# Patient Record
Sex: Male | Born: 1990 | Race: White | Hispanic: No | Marital: Married | Smoking: Current every day smoker
Health system: Southern US, Community
[De-identification: ages and names within clinical notes are randomized; demographics above are authoritative.]

## PROBLEM LIST (undated history)

## (undated) DIAGNOSIS — Z9889 Other specified postprocedural states: Secondary | ICD-10-CM

## (undated) DIAGNOSIS — F909 Attention-deficit hyperactivity disorder, unspecified type: Secondary | ICD-10-CM

## (undated) DIAGNOSIS — K649 Unspecified hemorrhoids: Secondary | ICD-10-CM

## (undated) HISTORY — DX: Attention-deficit hyperactivity disorder, unspecified type: F90.9

## (undated) HISTORY — PX: SHOULDER SURGERY: SHX246

---

## 2012-08-12 ENCOUNTER — Encounter (HOSPITAL_BASED_OUTPATIENT_CLINIC_OR_DEPARTMENT_OTHER): Payer: Self-pay | Admitting: *Deleted

## 2012-08-12 ENCOUNTER — Emergency Department (HOSPITAL_BASED_OUTPATIENT_CLINIC_OR_DEPARTMENT_OTHER)
Admission: EM | Admit: 2012-08-12 | Discharge: 2012-08-12 | Disposition: A | Discharge status: Home / Self Care | Payer: BC Managed Care – PPO | Attending: Emergency Medicine | Admitting: Emergency Medicine

## 2012-08-12 DIAGNOSIS — K645 Perianal venous thrombosis: Secondary | ICD-10-CM

## 2012-08-12 DIAGNOSIS — F17200 Nicotine dependence, unspecified, uncomplicated: Secondary | ICD-10-CM | POA: Insufficient documentation

## 2012-08-12 DIAGNOSIS — Z9889 Other specified postprocedural states: Secondary | ICD-10-CM | POA: Insufficient documentation

## 2012-08-12 DIAGNOSIS — K921 Melena: Secondary | ICD-10-CM | POA: Insufficient documentation

## 2012-08-12 DIAGNOSIS — Z79899 Other long term (current) drug therapy: Secondary | ICD-10-CM | POA: Insufficient documentation

## 2012-08-12 HISTORY — DX: Other specified postprocedural states: Z98.890

## 2012-08-12 HISTORY — DX: Unspecified hemorrhoids: K64.9

## 2012-08-12 MED ORDER — HYDROCORTISONE 2.5 % RE CREA: TOPICAL_CREAM | Freq: Two times a day (BID) | RECTAL | Status: DC

## 2012-08-12 MED ORDER — POLYETHYLENE GLYCOL 3350 17 GM/SCOOP PO POWD: 17.0000 g | Freq: Two times a day (BID) | ORAL | Status: AC

## 2012-08-12 MED ORDER — HYDROCODONE-ACETAMINOPHEN 5-325 MG PO TABS
1.0000 | ORAL_TABLET | Freq: Once | ORAL | Status: AC
  Administered 2012-08-12: 1 via ORAL
  Filled 2012-08-12: qty 1

## 2012-08-12 MED ORDER — HYDROCODONE-ACETAMINOPHEN 5-325 MG PO TABS: 1.0000 | ORAL_TABLET | ORAL | Status: DC | PRN

## 2012-08-12 NOTE — ED Provider Notes (Signed)
History  This chart was scribed for Jordan Human, MD by Greggory Stallion, ED Scribe. This patient was seen in room MH03/MH03 and the patient's care was started at 6:10 PM.   CSN: [id number]  Arrival date & time 08/12/12  1517   First MD Initiated Contact with Patient 08/12/12 1810      Chief Complaint  Patient presents with  . Rectal Pain     Patient is a 22 y.o. male presenting with hematochezia. The history is provided by the patient. No language interpreter was used.  Rectal Bleeding  The current episode started more than 2 weeks ago. The onset was gradual. The problem occurs frequently. The problem has been unchanged. The pain is moderate. Ineffective treatments: Preparation H. Associated symptoms include hemorrhoids and rectal pain. Pertinent negatives include no fever, no abdominal pain, no diarrhea, no vomiting, no chest pain, no headaches, no coughing, no difficulty breathing and no rash.    HPI Comments: Jordan Crawford is a 22 y.o. male with h/o hemorrhoids who presents to the Emergency Department complaining of intermittent, moderate rectal pain with associated rectal bleeding for the past 2 weeks. Patient reports a history of hemorrhoids since high school. He says that he has occasional flare ups, but for the past year they have been more frequent. Pt states he has taken Preparation H at home without any relief. He has not tried any other therapies. Pt denies fever, sore throat, chest pain, abdominal pain, cough, congestion, and rash as associated symptoms. Pt states he smokes half a pack of cigarettes a day. Pt denies any asthma, diabetes, and HTN. Pt states he has had shoulder surgery due to dislocation. Pt states he is currently on Adderall 30 mg.    Past Medical History  Diagnosis Date  . Hemorrhoid   . H/O shoulder surgery     History reviewed. No pertinent past surgical history.  History reviewed. No pertinent family history.  History  Substance Use Topics  .  Smoking status: Current Every Day Smoker  . Smokeless tobacco: Not on file  . Alcohol Use: No      Review of Systems  Constitutional: Negative for fever.  HENT: Negative for congestion, sore throat and rhinorrhea.   Eyes: Negative for visual disturbance.  Respiratory: Negative for cough.   Cardiovascular: Negative for chest pain.  Gastrointestinal: Positive for hematochezia, rectal pain and hemorrhoids. Negative for vomiting, abdominal pain and diarrhea.  Genitourinary: Negative for difficulty urinating.  Skin: Negative for rash.  Neurological: Negative for headaches.  All other systems reviewed and are negative.    Allergies  Review of patient's allergies indicates no known allergies.  Home Medications   Current Outpatient Rx  Name  Route  Sig  Dispense  Refill  . amphetamine-dextroamphetamine (ADDERALL) 30 MG tablet   Oral   Take 30 mg by mouth daily.           Triage Vitals: BP 108/76  Pulse 86  Temp(Src) 98.1 F (36.7 C) (Oral)  Resp 15  Ht 5\' 10"  (1.778 m)  Wt 165 lb (74.844 kg)  BMI 23.68 kg/m2  SpO2 100%  Physical Exam  Constitutional: He is oriented to person, place, and time. He appears well-developed and well-nourished.  HENT:  Head: Normocephalic and atraumatic.  Eyes: Conjunctivae and EOM are normal. Pupils are equal, round, and reactive to light.  Neck: Normal range of motion. Neck supple.  Cardiovascular: Normal rate, regular rhythm and normal heart sounds.   Pulmonary/Chest: Effort normal and breath sounds  normal.  Abdominal: Soft. There is no tenderness.  Genitourinary:  Thrombosed hemorrhoid that has bled on the right lateral wall of rectum. Tender to palpation.  Musculoskeletal: Normal range of motion.  Neurological: He is alert and oriented to person, place, and time.  Skin: Skin is warm and dry.    ED Course  Procedures (including critical care time)  DIAGNOSTIC STUDIES: Oxygen Saturation is 100% on RA, normal by my interpretation.     COORDINATION OF CARE: 6:48 PM-Discussed treatment plan which includes Anusol-HC cream, miralax and hydrocodone-acetaminophen with pt at bedside and pt agreed to plan.         1. Thrombosed external hemorrhoid     I personally performed the services described in this documentation, which was scribed in my presence. The recorded information has been reviewed and is accurate.  Jordan Crawford, M.D.      Carleene Cooper III, MD 08/13/12 [phone]

## 2012-08-12 NOTE — ED Notes (Signed)
Pt states he has prob with hem off and on for about a year. This episode x 2 weeks. Some bleeding noted.

## 2013-11-07 ENCOUNTER — Emergency Department (HOSPITAL_BASED_OUTPATIENT_CLINIC_OR_DEPARTMENT_OTHER): Payer: BC Managed Care – PPO

## 2013-11-07 ENCOUNTER — Encounter (HOSPITAL_BASED_OUTPATIENT_CLINIC_OR_DEPARTMENT_OTHER): Payer: Self-pay | Admitting: Emergency Medicine

## 2013-11-07 ENCOUNTER — Emergency Department (HOSPITAL_BASED_OUTPATIENT_CLINIC_OR_DEPARTMENT_OTHER)
Admission: EM | Admit: 2013-11-07 | Discharge: 2013-11-07 | Disposition: A | Discharge status: Home / Self Care | Payer: BC Managed Care – PPO | Attending: Emergency Medicine | Admitting: Emergency Medicine

## 2013-11-07 DIAGNOSIS — Z9889 Other specified postprocedural states: Secondary | ICD-10-CM | POA: Insufficient documentation

## 2013-11-07 DIAGNOSIS — Z8679 Personal history of other diseases of the circulatory system: Secondary | ICD-10-CM | POA: Insufficient documentation

## 2013-11-07 DIAGNOSIS — S46909A Unspecified injury of unspecified muscle, fascia and tendon at shoulder and upper arm level, unspecified arm, initial encounter: Secondary | ICD-10-CM | POA: Insufficient documentation

## 2013-11-07 DIAGNOSIS — Z79899 Other long term (current) drug therapy: Secondary | ICD-10-CM | POA: Insufficient documentation

## 2013-11-07 DIAGNOSIS — S4980XA Other specified injuries of shoulder and upper arm, unspecified arm, initial encounter: Secondary | ICD-10-CM | POA: Insufficient documentation

## 2013-11-07 DIAGNOSIS — S42101A Fracture of unspecified part of scapula, right shoulder, initial encounter for closed fracture: Secondary | ICD-10-CM

## 2013-11-07 DIAGNOSIS — F17200 Nicotine dependence, unspecified, uncomplicated: Secondary | ICD-10-CM | POA: Insufficient documentation

## 2013-11-07 DIAGNOSIS — S42109A Fracture of unspecified part of scapula, unspecified shoulder, initial encounter for closed fracture: Secondary | ICD-10-CM | POA: Insufficient documentation

## 2013-11-07 DIAGNOSIS — Y92410 Unspecified street and highway as the place of occurrence of the external cause: Secondary | ICD-10-CM | POA: Insufficient documentation

## 2013-11-07 DIAGNOSIS — Y9389 Activity, other specified: Secondary | ICD-10-CM | POA: Insufficient documentation

## 2013-11-07 MED ORDER — HYDROMORPHONE HCL PF 1 MG/ML IJ SOLN
1.0000 mg | Freq: Once | INTRAMUSCULAR | Status: AC
  Administered 2013-11-07: 1 mg via INTRAVENOUS
  Filled 2013-11-07: qty 1

## 2013-11-07 MED ORDER — OXYCODONE-ACETAMINOPHEN 5-325 MG PO TABS: 1.0000 | ORAL_TABLET | ORAL | Status: DC | PRN

## 2013-11-07 MED ORDER — ONDANSETRON HCL 4 MG/2ML IJ SOLN
4.0000 mg | Freq: Once | INTRAMUSCULAR | Status: AC
  Administered 2013-11-07: 4 mg via INTRAVENOUS
  Filled 2013-11-07: qty 2

## 2013-11-07 NOTE — Discharge Instructions (Signed)
Scapular Fracture You have a fracture (break in bone) of your scapula. This is your shoulder blade. It is the large flat bone behind your shoulder. This is also the bone that makes up the ball and socket joint of your shoulder. Most of the time surgery is not required for injuries to this bone unless the socket of the shoulder joint is involved. DIAGNOSIS  Because of the severity of force usually required to break this bone, x-rays are often taken of other bones likely to be injured at the same time. X-rays of the hip, knee, and pelvis may be taken. Specialized x-rays (arteriograms) may be needed if there are injuries to large blood vessels associated with this injury. HOME CARE INSTRUCTIONS   Simple fractures of the scapula can be treated with a sling and swathe type of immobilization. This means the involved area is held in place by putting the arm in a sling. A wrap is made around the upper arm with the sling holding the arm next to the chest. This may be removed for bathing as instructed by your caregiver.  Apply ice to the injury for 15-20 minutes 03-04 times per day. Put the ice in a plastic bag. Place a towel between the bag of ice and your skin, splint, or immobilization device.  Do not resume use until instructed by your caregiver. Usually full rehabilitation (exercises to improve the injury site) will begin sometime after the sling and swathe are removed. Then begin use gradually as directed. Do not increase use to the point of pain. If pain develops, decrease use and continue the above measures. Slowly increase activities that do not cause discomfort until you gradually achieve normal use without pain.  Only take over-the-counter or prescription medicines for pain, discomfort, or fever as directed by your caregiver. SEEK IMMEDIATE MEDICAL CARE IF:   Your pain and swelling increase and is uncontrolled with medications.  You develop new, unexplained symptoms or an increase of the symptoms  which brought you to your caregiver.  You develop shortness or breath or cough up blood.  You are unable to move your arm or fingers. You develop warmth and swelling in your affected arm.  You develop an unexplained temperature. Document Released: 04/11/2005 Document Revised: 07/04/2011 Document Reviewed: 03/03/2006 [facility] Patient Information 2015 [place], [place]. This information is not intended to replace advice given to you by your health care provider. Make sure you discuss any questions you have with your health care provider.   Sling Use After Injury or Surgery You have been put in a sling today because of an injury or following surgery. If you have a tendon or bone injury it may take up to 6 weeks to heal. Use the sling as directed until your caregiver says it is no longer needed. The sling protects and keeps you from using the injured part. Hanging your arm in a sling will give rest and support to the injured part. This also helps with comfort and healing. Slings are used for injuries made worse or more painful by movement. Examples include: Broken arms. Broken collarbones. Shoulder injuries. Following surgery. The sling should fit comfortably, with your elbow at one end of the sling and your hand at the other end. Your elbow is bent 90 degrees lying across your waist and rests in the sling with your thumb pointing up. Make sure that the hand of the injured arm does not droop down. That could stretch some nerves in the wrist. Your hand should be slightly  higher than your elbow. You may also pad the sling behind your neck with some cloth or foam rubber.  A swathe may also be used if it is necessary to keep you from lifting your injured arm. A swathe is a wrap or ace bandage that goes around your chest over your injured arm.  To take the weight off your neck, some slings have a strap that goes around your neck and down your back. One strap is connected to the closed elbow side of the sling  with the other end of the strap attached to the wrist side. With a sling like this, your injured shoulder, arm, wrist, or hand is in the sling, the weight is more on your shoulder and back. This is different from the illustration where the sling is supported only by the neck.  In an emergency, a sling can be as simple as a belt or towel tied around your neck to hold your forearm.  HOME CARE INSTRUCTIONS  Do not use your shoulder until instructed to by your caregiver. If you have been prescribed physical therapy, keep appointments as directed. For the first couple days following your injury and during times when you are sore, you may use ice on the injured area for 15-20 minutes 03-04 times per day while awake. Put the ice in a plastic bag and place a towel between the bag of ice and your skin. This will help keep the swelling down. If there is numbness in the fifth finger and ring fingers you may need to pad the elbow to relieve pressure on the ulnar nerve (the crazy bone). Keep your arm on your chest when lying down. If a plaster splint was applied, wear the splint until you are seen for a follow-up examination. Rest it on nothing harder than a pillow the first 24 hours. Do not get it wet. You may take it off to take a shower or bath unless instructed otherwise by your caregiver. You may have been given an elastic bandage to use with the plaster splint or alone. The splint is too tight if you have numbness, tingling, or if your hand becomes cold and blue. Adjust or reapply the bandage to make it comfortable. Only take over-the-counter or prescription medicines for pain, discomfort, or fever as directed by your caregiver. If range of motion exercises are permitted by your caregiver, do not go over the limits suggested. If you have increased pain from doing gentle exercises, stop the exercises until you see your caregiver again. The length of time needed for healing depends on what your injury or surgery  was. SEEK IMMEDIATE MEDICAL CARE IF:  You have an increase in bruising, swelling or pain in the area of your injury or surgery. You notice a blue color of or coldness in your fingers. Pain relief is not obtained with medications or any of your problems are getting worse. Document Released: 11/24/2003 Document Revised: 03/28/2012 Document Reviewed: 02/24/2007 [facility] Patient Information 2015 [place], [place]. This information is not intended to replace advice given to you by your health care provider. Make sure you discuss any questions you have with your health care provider.
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
## 2013-11-15 ENCOUNTER — Encounter: Payer: Self-pay | Admitting: Family Medicine

## 2013-11-15 ENCOUNTER — Ambulatory Visit (INDEPENDENT_AMBULATORY_CARE_PROVIDER_SITE_OTHER): Payer: BC Managed Care – PPO | Admitting: Family Medicine

## 2013-11-15 VITALS — BP 107/68 | HR 81 | Ht 70.0 in | Wt 175.0 lb

## 2013-11-15 DIAGNOSIS — S42101A Fracture of unspecified part of scapula, right shoulder, initial encounter for closed fracture: Secondary | ICD-10-CM

## 2013-11-15 DIAGNOSIS — S42109A Fracture of unspecified part of scapula, unspecified shoulder, initial encounter for closed fracture: Secondary | ICD-10-CM

## 2013-11-15 MED ORDER — OXYCODONE-ACETAMINOPHEN 7.5-325 MG PO TABS: 1.0000 | ORAL_TABLET | Freq: Four times a day (QID) | ORAL | Status: DC | PRN

## 2013-11-15 NOTE — Patient Instructions (Signed)
You have a right scapular fracture. These typically heal extremely well after 6 weeks. Follow up with me on or close to 8/27 - out of work until that time. Percocet as needed for severe pain. Ok to take aleve or ibuprofen as needed in addition to this. Call me in 2 weeks for a refill on the pain medicine. Icing as needed 15 minutes at a time. Sling for comfort.

## 2013-11-18 ENCOUNTER — Encounter: Payer: Self-pay | Admitting: Family Medicine

## 2013-11-18 DIAGNOSIS — S42101A Fracture of unspecified part of scapula, right shoulder, initial encounter for closed fracture: Secondary | ICD-10-CM | POA: Insufficient documentation

## 2013-11-18 NOTE — Assessment & Plan Note (Signed)
Right scapular body fracture - minimally displaced.  Expect 6 total weeks to heal.  Sling for comfort as needed.  Icing, percocet for pain.  NSAIDs as well.  F/u on 8/27.  Out of work in the meantime.
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
## 2013-12-02 ENCOUNTER — Telehealth: Payer: Self-pay | Admitting: Family Medicine

## 2013-12-02 DIAGNOSIS — S42101A Fracture of unspecified part of scapula, right shoulder, initial encounter for closed fracture: Secondary | ICD-10-CM

## 2013-12-02 MED ORDER — OXYCODONE-ACETAMINOPHEN 7.5-325 MG PO TABS: 1.0000 | ORAL_TABLET | Freq: Four times a day (QID) | ORAL | Status: DC | PRN

## 2013-12-02 NOTE — Telephone Encounter (Signed)
Yes - he has a scapular body fracture  Will print out and place up front for him to pick up.  Thanks!

## 2013-12-18 ENCOUNTER — Encounter: Payer: Self-pay | Admitting: Family Medicine

## 2013-12-18 ENCOUNTER — Ambulatory Visit (INDEPENDENT_AMBULATORY_CARE_PROVIDER_SITE_OTHER): Payer: BC Managed Care – PPO | Admitting: Family Medicine

## 2013-12-18 VITALS — BP 127/80 | HR 62 | Ht 70.0 in | Wt 190.0 lb

## 2013-12-18 DIAGNOSIS — S42101A Fracture of unspecified part of scapula, right shoulder, initial encounter for closed fracture: Secondary | ICD-10-CM

## 2013-12-18 DIAGNOSIS — S42109A Fracture of unspecified part of scapula, unspecified shoulder, initial encounter for closed fracture: Secondary | ICD-10-CM

## 2013-12-18 MED ORDER — TRAMADOL HCL 50 MG PO TABS: 50.0000 mg | ORAL_TABLET | Freq: Four times a day (QID) | ORAL | Status: DC | PRN

## 2013-12-19 ENCOUNTER — Encounter: Payer: Self-pay | Admitting: Family Medicine
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
## 2013-12-23 ENCOUNTER — Ambulatory Visit: Payer: BC Managed Care – PPO | Admitting: Family Medicine

## 2016-10-11 ENCOUNTER — Emergency Department (HOSPITAL_BASED_OUTPATIENT_CLINIC_OR_DEPARTMENT_OTHER)
Admission: EM | Admit: 2016-10-11 | Discharge: 2016-10-12 | Disposition: A | Discharge status: Home / Self Care | Payer: Managed Care, Other (non HMO) | Attending: Physician Assistant | Admitting: Physician Assistant

## 2016-10-11 ENCOUNTER — Emergency Department (HOSPITAL_BASED_OUTPATIENT_CLINIC_OR_DEPARTMENT_OTHER): Payer: Managed Care, Other (non HMO)

## 2016-10-11 ENCOUNTER — Encounter (HOSPITAL_BASED_OUTPATIENT_CLINIC_OR_DEPARTMENT_OTHER): Payer: Self-pay | Admitting: Emergency Medicine

## 2016-10-11 DIAGNOSIS — F17210 Nicotine dependence, cigarettes, uncomplicated: Secondary | ICD-10-CM | POA: Insufficient documentation

## 2016-10-11 DIAGNOSIS — Z79899 Other long term (current) drug therapy: Secondary | ICD-10-CM | POA: Insufficient documentation

## 2016-10-11 DIAGNOSIS — N200 Calculus of kidney: Secondary | ICD-10-CM | POA: Diagnosis not present

## 2016-10-11 DIAGNOSIS — R1084 Generalized abdominal pain: Secondary | ICD-10-CM | POA: Diagnosis present

## 2016-10-11 LAB — CBC WITH DIFFERENTIAL/PLATELET
BASOS ABS: 0 10*3/uL (ref 0.0–0.1)
BASOS PCT: 0 %
EOS ABS: 0.1 10*3/uL (ref 0.0–0.7)
EOS PCT: 1 %
HCT: 40.4 % (ref 39.0–52.0)
HEMOGLOBIN: 14.7 g/dL (ref 13.0–17.0)
Lymphocytes Relative: 19 %
Lymphs Abs: 1.8 10*3/uL (ref 0.7–4.0)
MCH: 30.9 pg (ref 26.0–34.0)
MCHC: 36.4 g/dL — ABNORMAL HIGH (ref 30.0–36.0)
MCV: 85.1 fL (ref 78.0–100.0)
Monocytes Absolute: 0.6 10*3/uL (ref 0.1–1.0)
Monocytes Relative: 6 %
NEUTROS PCT: 74 %
Neutro Abs: 7 10*3/uL (ref 1.7–7.7)
PLATELETS: 269 10*3/uL (ref 150–400)
RBC: 4.75 MIL/uL (ref 4.22–5.81)
RDW: 12.8 % (ref 11.5–15.5)
WBC: 9.5 10*3/uL (ref 4.0–10.5)

## 2016-10-11 LAB — URINALYSIS, ROUTINE W REFLEX MICROSCOPIC
GLUCOSE, UA: 100 mg/dL — AB
Ketones, ur: 40 mg/dL — AB
Nitrite: NEGATIVE
PROTEIN: 100 mg/dL — AB
Specific Gravity, Urine: 1.027 (ref 1.005–1.030)
pH: 8.5 — ABNORMAL HIGH (ref 5.0–8.0)

## 2016-10-11 LAB — URINALYSIS, MICROSCOPIC (REFLEX)

## 2016-10-11 LAB — COMPREHENSIVE METABOLIC PANEL
ALT: 22 U/L (ref 17–63)
AST: 27 U/L (ref 15–41)
Albumin: 4.9 g/dL (ref 3.5–5.0)
Alkaline Phosphatase: 67 U/L (ref 38–126)
Anion gap: 12 (ref 5–15)
BILIRUBIN TOTAL: 1 mg/dL (ref 0.3–1.2)
BUN: 14 mg/dL (ref 6–20)
CALCIUM: 10 mg/dL (ref 8.9–10.3)
CHLORIDE: 103 mmol/L (ref 101–111)
CO2: 23 mmol/L (ref 22–32)
CREATININE: 1.2 mg/dL (ref 0.61–1.24)
GFR calc Af Amer: >60 mL/min (ref >60)
Glucose, Bld: 116 mg/dL — ABNORMAL HIGH (ref 65–99)
Potassium: 3.3 mmol/L — ABNORMAL LOW (ref 3.5–5.1)
Sodium: 138 mmol/L (ref 135–145)
TOTAL PROTEIN: 8 g/dL (ref 6.5–8.1)

## 2016-10-11 MED ORDER — MORPHINE SULFATE (PF) 4 MG/ML IV SOLN
4.0000 mg | Freq: Once | INTRAVENOUS | Status: AC
  Administered 2016-10-11: 4 mg via INTRAVENOUS
  Filled 2016-10-11: qty 1

## 2016-10-11 MED ORDER — ONDANSETRON HCL 4 MG/2ML IJ SOLN
4.0000 mg | Freq: Once | INTRAMUSCULAR | Status: AC
  Administered 2016-10-11: 4 mg via INTRAVENOUS
  Filled 2016-10-11: qty 2

## 2016-10-11 MED ORDER — OXYCODONE-ACETAMINOPHEN 5-325 MG PO TABS
1.0000 | ORAL_TABLET | Freq: Once | ORAL | Status: AC
  Administered 2016-10-11: 1 via ORAL
  Filled 2016-10-11: qty 1

## 2016-10-11 MED ORDER — ONDANSETRON 4 MG PO TBDP
4.0000 mg | ORAL_TABLET | Freq: Once | ORAL | Status: AC
  Administered 2016-10-11: 4 mg via ORAL
  Filled 2016-10-11: qty 1

## 2016-10-11 MED ORDER — OXYCODONE-ACETAMINOPHEN 5-325 MG PO TABS: 1.0000 | ORAL_TABLET | Freq: Four times a day (QID) | ORAL | 0 refills | Status: DC | PRN

## 2016-10-11 MED ORDER — TAMSULOSIN HCL 0.4 MG PO CAPS: 0.4000 mg | ORAL_CAPSULE | Freq: Every day | ORAL | 0 refills | Status: AC

## 2016-10-11 MED ORDER — SODIUM CHLORIDE 0.9 % IV BOLUS (SEPSIS)
1000.0000 mL | Freq: Once | INTRAVENOUS | Status: AC
  Administered 2016-10-11: 1000 mL via INTRAVENOUS

## 2016-10-11 NOTE — ED Triage Notes (Signed)
Patient states that he started to have pain to his left lower back. Patient was given 60 of IM tordal at an urgent care and sent her for further evaluation. Patient also reports that he has had N/V.

## 2016-10-11 NOTE — ED Provider Notes (Signed)
[facility] Provider Note   CSN: [id number] Arrival date & time: 10/11/16  2016   By signing my name below, I, Clarisse Gouge, attest that this documentation has been prepared under the direction and in the presence of Christmas Faraci, Cindee Salt, MD. Electronically signed, Clarisse Gouge, ED Scribe. 10/11/16. 9:17 PM.   History   Chief Complaint Chief Complaint  Patient presents with  . Flank Pain   The history is provided by the patient and medical records. No language interpreter was used.    Jordan Crawford is a 26 y.o. male presnting to the Emergency Department concerning gradually worsening L flank pain since 4 PM today. Pt alleges his pain began right after a BM. He states his pain became severe ~6 PM and he describes constant sharp, throbbing pain worse with certain movements and contact. Some L leg numbness, N/V and dark urine noted. Pt at baseline until onset of presenting symptoms. Pt sent to [facility] ED from UC to R/O kidney stone. Pt given toradol at UC with no relief. No h/o kidney stones. No recent injury/ trauma. No dysuria, hematuria, fever. No other complaints at this time.   Past Medical History:  Diagnosis Date  . ADHD (attention deficit hyperactivity disorder)   . H/O shoulder surgery   . Hemorrhoid     Patient Active Problem List   Diagnosis Date Noted  . Right scapula fracture 11/18/2013    Past Surgical History:  Procedure Laterality Date  . SHOULDER SURGERY Left    rotator cuff       Home Medications    Prior to Admission medications   Medication Sig Start Date End Date Taking? Authorizing Provider  amphetamine-dextroamphetamine (ADDERALL) 30 MG tablet Take 30 mg by mouth daily.    [provider]  oxyCODONE-acetaminophen (PERCOCET) 7.5-325 MG per tablet Take 1 tablet by mouth. 12/02/13   [provider]  polyethylene glycol powder (GLYCOLAX/MIRALAX) powder Take 17 g by mouth 2 (two) times daily. 08/12/12   Carleene Cooper, MD    traMADol (ULTRAM) 50 MG tablet Take 1 tablet (50 mg total) by mouth every 6 (six) hours as needed. 12/18/13   Lenda Kelp, MD    Family History History reviewed. No pertinent family history.  Social History Social History  Substance Use Topics  . Smoking status: Current Every Day Smoker    Packs/day: 0.50    Types: Cigarettes  . Smokeless tobacco: Never Used  . Alcohol use No     Allergies   Patient has no known allergies.   Review of Systems Review of Systems  Constitutional: Negative for fever.  Gastrointestinal: Positive for nausea and vomiting. Negative for abdominal pain.  Genitourinary: Positive for flank pain. Negative for dysuria.  Neurological: Positive for numbness.  All other systems reviewed and are negative.    Physical Exam Updated Vital Signs BP 122/85 (BP Location: Right Arm)   Pulse 66   Resp 18   Ht 5\' 10"  (1.778 m)   Wt 200 lb (90.7 kg)   SpO2 100%   BMI 28.70 kg/m   Physical Exam  Constitutional: He is oriented to person, place, and time. He appears well-developed and well-nourished.  HENT:  Head: Normocephalic.  Eyes: Conjunctivae and EOM are normal. Pupils are equal, round, and reactive to light. No scleral icterus.  Neck: Normal range of motion.  Cardiovascular: Normal rate, regular rhythm and normal heart sounds.  Exam reveals no friction rub.   No murmur heard. Pulmonary/Chest: Effort normal. No respiratory distress.  Abdominal: Soft. Normal appearance. He exhibits no distension. There is CVA tenderness (L sided).  Musculoskeletal: Normal range of motion.  Neurological: He is alert and oriented to person, place, and time.  Skin: There is pallor.  Psychiatric: He has a normal mood and affect.  Nursing note and vitals reviewed.    ED Treatments / Results  DIAGNOSTIC STUDIES: Oxygen Saturation is 100% on RA, NL by my interpretation.    COORDINATION OF CARE: 9:09 PM-Discussed next steps with pt. Pt verbalized understanding  and is agreeable with the plan. Will order fluids, imaging and labs.   Labs (all labs ordered are listed, but only abnormal results are displayed) Labs Reviewed  URINALYSIS, ROUTINE W REFLEX MICROSCOPIC    EKG  EKG Interpretation None       Radiology No results found.  Procedures Procedures (including critical care time)  Medications Ordered in ED Medications - No data to display   Initial Impression / Assessment and Plan / ED Course  I have reviewed the triage vital signs and the nursing notes.  Pertinent labs & imaging results that were available during my care of the patient were reviewed by me and considered in my medical decision making (see chart for details).     I personally performed the services described in this documentation, which was scribed in my presence. The recorded information has been reviewed and is accurate.   Patient [age]-year-old male presenting with left flank pain. Patient has no history of stones. Patient's flank pain started on acute onset at 6 PM went to urgent care and sent here for further evaluation. Typical story for a stone. We'll do CT stone, labs.  CT showed likely passing stone. Patient's having some pain. We'll discharge with pain medication Flomax and follow up with urology.  Patient is comfortable, ambulatory, and taking PO at time of discharge.  Patient expressed understanding about return precautions.    Final Clinical Impressions(s) / ED Diagnoses   Final diagnoses:  None    New Prescriptions New Prescriptions   No medications on file     Abelino DerrickMackuen, [place] Rios Lyn, MD 10/12/16 1720

## 2016-10-11 NOTE — Discharge Instructions (Signed)
You were seen today and found to have a kidney stone. We think is likely passing. If your pain gets better and it passes there is no need to follow up with urology immediatly. Otherwise you can follow-up as an outpatient as needed.

## 2016-10-11 NOTE — ED Notes (Signed)
ED Provider at bedside. 

## 2017-04-06 ENCOUNTER — Emergency Department (HOSPITAL_COMMUNITY): Payer: Managed Care, Other (non HMO)

## 2017-04-06 ENCOUNTER — Other Ambulatory Visit: Payer: Self-pay

## 2017-04-06 ENCOUNTER — Encounter (HOSPITAL_COMMUNITY): Payer: Self-pay | Admitting: *Deleted

## 2017-04-06 ENCOUNTER — Emergency Department (HOSPITAL_COMMUNITY)
Admission: EM | Admit: 2017-04-06 | Discharge: 2017-04-06 | Disposition: A | Discharge status: Home / Self Care | Payer: Managed Care, Other (non HMO) | Attending: Emergency Medicine | Admitting: Emergency Medicine

## 2017-04-06 DIAGNOSIS — Z79899 Other long term (current) drug therapy: Secondary | ICD-10-CM | POA: Diagnosis not present

## 2017-04-06 DIAGNOSIS — Z041 Encounter for examination and observation following transport accident: Secondary | ICD-10-CM | POA: Insufficient documentation

## 2017-04-06 DIAGNOSIS — F17210 Nicotine dependence, cigarettes, uncomplicated: Secondary | ICD-10-CM | POA: Insufficient documentation

## 2017-04-06 DIAGNOSIS — R51 Headache: Secondary | ICD-10-CM | POA: Insufficient documentation

## 2017-04-06 DIAGNOSIS — M25512 Pain in left shoulder: Secondary | ICD-10-CM | POA: Insufficient documentation

## 2017-04-06 MED ORDER — METHOCARBAMOL 500 MG PO TABS: 500.0000 mg | ORAL_TABLET | Freq: Two times a day (BID) | ORAL | 0 refills | Status: AC

## 2017-04-06 MED ORDER — NAPROXEN 375 MG PO TABS: 375.0000 mg | ORAL_TABLET | Freq: Two times a day (BID) | ORAL | 0 refills | Status: AC

## 2017-04-06 MED ORDER — IBUPROFEN 800 MG PO TABS
800.0000 mg | ORAL_TABLET | Freq: Once | ORAL | Status: AC
  Administered 2017-04-06: 800 mg via ORAL
  Filled 2017-04-06: qty 1

## 2017-04-06 NOTE — ED Notes (Signed)
Got patient into a gown on the monitor patient is resting with call bell in reach and family at bedside ?
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
## 2017-04-06 NOTE — Discharge Instructions (Signed)
Prescription given for Naproxen and Robaxin. Take medication as directed. Do not operate machinery, drive a car, or work while taking Robaxin as it can make you drowsy. Do not take Naproxen with any other NSAIDs.
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
## 2020-01-08 ENCOUNTER — Encounter (HOSPITAL_BASED_OUTPATIENT_CLINIC_OR_DEPARTMENT_OTHER): Payer: Self-pay | Admitting: *Deleted

## 2020-01-08 ENCOUNTER — Ambulatory Visit (HOSPITAL_BASED_OUTPATIENT_CLINIC_OR_DEPARTMENT_OTHER)
Admission: EM | Admit: 2020-01-08 | Discharge: 2020-01-09 | Disposition: A | Discharge status: Home / Self Care | Payer: Managed Care, Other (non HMO) | Attending: Emergency Medicine | Admitting: Emergency Medicine

## 2020-01-08 ENCOUNTER — Emergency Department (HOSPITAL_COMMUNITY): Payer: Managed Care, Other (non HMO) | Admitting: Anesthesiology

## 2020-01-08 ENCOUNTER — Encounter (HOSPITAL_COMMUNITY)
Admission: EM | Disposition: A | Discharge status: Home / Self Care | Payer: Self-pay | Source: Home / Self Care | Attending: Emergency Medicine

## 2020-01-08 ENCOUNTER — Inpatient Hospital Stay: Admit: 2020-01-08 | Payer: Managed Care, Other (non HMO) | Admitting: Urology

## 2020-01-08 ENCOUNTER — Encounter: Payer: Self-pay | Admitting: Urology

## 2020-01-08 ENCOUNTER — Other Ambulatory Visit: Payer: Self-pay

## 2020-01-08 DIAGNOSIS — Z79899 Other long term (current) drug therapy: Secondary | ICD-10-CM | POA: Insufficient documentation

## 2020-01-08 DIAGNOSIS — N4889 Other specified disorders of penis: Secondary | ICD-10-CM | POA: Insufficient documentation

## 2020-01-08 DIAGNOSIS — Z20822 Contact with and (suspected) exposure to covid-19: Secondary | ICD-10-CM | POA: Diagnosis not present

## 2020-01-08 DIAGNOSIS — F901 Attention-deficit hyperactivity disorder, predominantly hyperactive type: Secondary | ICD-10-CM | POA: Insufficient documentation

## 2020-01-08 DIAGNOSIS — F17210 Nicotine dependence, cigarettes, uncomplicated: Secondary | ICD-10-CM | POA: Diagnosis not present

## 2020-01-08 DIAGNOSIS — X58XXXA Exposure to other specified factors, initial encounter: Secondary | ICD-10-CM | POA: Insufficient documentation

## 2020-01-08 DIAGNOSIS — S39840A Fracture of corpus cavernosum penis, initial encounter: Secondary | ICD-10-CM | POA: Insufficient documentation

## 2020-01-08 HISTORY — PX: REPAIR OF FRACTURED PENIS: SHX6063

## 2020-01-08 HISTORY — PX: CYSTOSCOPY: SHX5120

## 2020-01-08 LAB — SARS CORONAVIRUS 2 BY RT PCR (HOSPITAL ORDER, PERFORMED IN ~~LOC~~ HOSPITAL LAB): SARS Coronavirus 2: NEGATIVE

## 2020-01-08 SURGERY — REPAIR, FRACTURE, PENIS
Anesthesia: General | Site: Penis

## 2020-01-08 MED ORDER — OXYCODONE HCL 5 MG PO TABS
5.0000 mg | ORAL_TABLET | Freq: Once | ORAL | Status: AC | PRN
Start: 2020-01-08 — End: 2020-01-09
  Administered 2020-01-09: 5 mg via ORAL

## 2020-01-08 MED ORDER — DEXAMETHASONE SODIUM PHOSPHATE 10 MG/ML IJ SOLN
INTRAMUSCULAR | Status: DC | PRN
  Administered 2020-01-08: 10 mg via INTRAVENOUS

## 2020-01-08 MED ORDER — 0.9 % SODIUM CHLORIDE (POUR BTL) OPTIME
TOPICAL | Status: DC | PRN
  Administered 2020-01-08: 23:00:00 1000 mL

## 2020-01-08 MED ORDER — MIDAZOLAM HCL 2 MG/2ML IJ SOLN
INTRAMUSCULAR | Status: AC
  Filled 2020-01-08: qty 2

## 2020-01-08 MED ORDER — BUPIVACAINE HCL 0.25 % IJ SOLN
INTRAMUSCULAR | Status: AC
Start: 2020-01-08 — End: ?
  Filled 2020-01-08: qty 1

## 2020-01-08 MED ORDER — TRAMADOL HCL 50 MG PO TABS: 50.0000 mg | ORAL_TABLET | Freq: Four times a day (QID) | ORAL | 0 refills | Status: DC | PRN

## 2020-01-08 MED ORDER — MIDAZOLAM HCL 5 MG/5ML IJ SOLN
INTRAMUSCULAR | Status: DC | PRN
  Administered 2020-01-08: 2 mg via INTRAVENOUS

## 2020-01-08 MED ORDER — PROPOFOL 10 MG/ML IV BOLUS
INTRAVENOUS | Status: DC | PRN
  Administered 2020-01-08: 200 mg via INTRAVENOUS

## 2020-01-08 MED ORDER — KETOROLAC TROMETHAMINE 30 MG/ML IJ SOLN: 30.0000 mg | Freq: Once | INTRAMUSCULAR | Status: AC | PRN

## 2020-01-08 MED ORDER — OXYCODONE HCL 5 MG/5ML PO SOLN: 5.0000 mg | Freq: Once | ORAL | Status: AC | PRN

## 2020-01-08 MED ORDER — SODIUM CHLORIDE 0.9 % IR SOLN
Status: DC | PRN
  Administered 2020-01-08: 1000 mL

## 2020-01-08 MED ORDER — ONDANSETRON HCL 4 MG/2ML IJ SOLN
INTRAMUSCULAR | Status: DC | PRN
  Administered 2020-01-08: 4 mg via INTRAVENOUS

## 2020-01-08 MED ORDER — HYDROMORPHONE HCL 1 MG/ML IJ SOLN: 0.2500 mg | INTRAMUSCULAR | Status: DC | PRN

## 2020-01-08 MED ORDER — MEPERIDINE HCL 50 MG/ML IJ SOLN: 6.2500 mg | INTRAMUSCULAR | Status: DC | PRN

## 2020-01-08 MED ORDER — CEFAZOLIN SODIUM-DEXTROSE 2-4 GM/100ML-% IV SOLN
INTRAVENOUS | Status: AC
  Filled 2020-01-08: qty 100

## 2020-01-08 MED ORDER — LACTATED RINGERS IV SOLN: INTRAVENOUS | Status: DC | PRN

## 2020-01-08 MED ORDER — PROPOFOL 10 MG/ML IV BOLUS
INTRAVENOUS | Status: AC
  Filled 2020-01-08: qty 20

## 2020-01-08 MED ORDER — SUCCINYLCHOLINE CHLORIDE 20 MG/ML IJ SOLN
INTRAMUSCULAR | Status: DC | PRN
  Administered 2020-01-08: 90 mg via INTRAVENOUS

## 2020-01-08 MED ORDER — ACETAMINOPHEN 10 MG/ML IV SOLN
1000.0000 mg | Freq: Once | INTRAVENOUS | Status: AC
  Administered 2020-01-09: 1000 mg via INTRAVENOUS

## 2020-01-08 MED ORDER — FENTANYL CITRATE (PF) 100 MCG/2ML IJ SOLN
INTRAMUSCULAR | Status: DC | PRN
  Administered 2020-01-08: 100 ug via INTRAVENOUS
  Administered 2020-01-08: 50 ug via INTRAVENOUS

## 2020-01-08 MED ORDER — HYDROCODONE-ACETAMINOPHEN 5-325 MG PO TABS
2.0000 | ORAL_TABLET | Freq: Once | ORAL | Status: AC
  Administered 2020-01-08: 20:00:00 2 via ORAL
  Filled 2020-01-08: qty 2

## 2020-01-08 MED ORDER — BACITRACIN ZINC 500 UNIT/GM EX OINT
TOPICAL_OINTMENT | CUTANEOUS | Status: AC
  Filled 2020-01-08: qty 28.35

## 2020-01-08 MED ORDER — PROMETHAZINE HCL 25 MG/ML IJ SOLN: 6.2500 mg | INTRAMUSCULAR | Status: DC | PRN

## 2020-01-08 MED ORDER — FENTANYL CITRATE (PF) 100 MCG/2ML IJ SOLN
INTRAMUSCULAR | Status: AC
  Filled 2020-01-08: qty 2

## 2020-01-08 MED ORDER — LIDOCAINE 2% (20 MG/ML) 5 ML SYRINGE
INTRAMUSCULAR | Status: DC | PRN
  Administered 2020-01-08: 60 mg via INTRAVENOUS

## 2020-01-08 MED ORDER — CEFAZOLIN SODIUM-DEXTROSE 2-3 GM-%(50ML) IV SOLR
INTRAVENOUS | Status: DC | PRN
  Administered 2020-01-08: 2 g via INTRAVENOUS

## 2020-01-08 SURGICAL SUPPLY — 32 items
BLADE SURG 15 STRL LF DISP TIS (BLADE) ×2 IMPLANT
BLADE SURG 15 STRL SS (BLADE) ×4
BLOOD SET COLLECTION 21G x 3/4" ×4 IMPLANT
BNDG COHESIVE 1X5 TAN STRL LF (GAUZE/BANDAGES/DRESSINGS) ×4 IMPLANT
BNDG COHESIVE 2X5 TAN STRL LF (GAUZE/BANDAGES/DRESSINGS) ×4 IMPLANT
DRAIN PENROSE 0.5X18 (DRAIN) ×4 IMPLANT
DRAPE LAPAROTOMY T 98X78 PEDS (DRAPES) ×4 IMPLANT
DRSG VASELINE 3X18 (GAUZE/BANDAGES/DRESSINGS) ×4 IMPLANT
ELECT REM PT RETURN 15FT ADLT (MISCELLANEOUS) ×4 IMPLANT
GAUZE 4X4 16PLY RFD (DISPOSABLE) ×8 IMPLANT
GAUZE PETROLATUM 1 X8 (GAUZE/BANDAGES/DRESSINGS) ×4 IMPLANT
GAUZE SPONGE 4X4 12PLY STRL (GAUZE/BANDAGES/DRESSINGS) ×4 IMPLANT
GAUZE VASELINE 3X9 (GAUZE/BANDAGES/DRESSINGS) ×4 IMPLANT
GLOVE BIOGEL M STRL SZ7.5 (GLOVE) ×4 IMPLANT
GOWN STRL REUS W/TWL XL LVL3 (GOWN DISPOSABLE) ×4 IMPLANT
KIT BASIN OR (CUSTOM PROCEDURE TRAY) ×4 IMPLANT
KIT TURNOVER KIT A (KITS) ×4 IMPLANT
LUBRICANT JELLY K Y 4OZ (MISCELLANEOUS) ×4 IMPLANT
NEEDLE HYPO 22GX1.5 SAFETY (NEEDLE) ×8 IMPLANT
PACK GENERAL/GYN (CUSTOM PROCEDURE TRAY) ×4 IMPLANT
PENCIL SMOKE EVACUATOR (MISCELLANEOUS) ×4 IMPLANT
SET COLLECT BLD 21X3/4 12 PB (MISCELLANEOUS) ×4 IMPLANT
SET COLLECT BLD 23X.75 12 LT B (MISCELLANEOUS) ×4 IMPLANT
SET CYSTO W/LG BORE CLAMP LF (SET/KITS/TRAYS/PACK) ×4 IMPLANT
SUT CHROMIC 3 0 SH 27 (SUTURE) ×8 IMPLANT
SUT PDS AB 3-0 SH 27 (SUTURE) ×8 IMPLANT
SYR 10ML LL (SYRINGE) ×4 IMPLANT
SYR 20ML LL LF (SYRINGE) ×4 IMPLANT
SYR 50ML LL SCALE MARK (SYRINGE) ×8 IMPLANT
SYR CONTROL 10ML LL (SYRINGE) ×4 IMPLANT
TOWEL OR 17X26 10 PK STRL BLUE (TOWEL DISPOSABLE) ×4 IMPLANT
TOWEL OR NON WOVEN STRL DISP B (DISPOSABLE) ×4 IMPLANT
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
## 2020-01-08 NOTE — ED Notes (Signed)
Provider in to exam patient . This RN chaperoned. Ice applied.

## 2020-01-08 NOTE — Consult Note (Signed)
Urology Consult   Physician requesting consult: Cathren Laine, MD   Reason for consult: Likely penile fracture  History of Present Illness: Jordan Crawford is a 29 y.o. healthy male who presents to the ED today for penile pain.  Patient reports intermittently bending his erect penis until a click is heard for pleasure since he was a child. He performed this today at about 5pm but instead felt a pop associated with pain and immediate detumescence with bruising of penile shaft. He has not yet urinated due to fear of pain with urination.   He is otherwise healthy. He is circumcised. Not on blood thinners. Denies prior urologic history.  Last ate at 4pm today. COVID negative  Past Medical History:  Diagnosis Date  . ADHD (attention deficit hyperactivity disorder)   . H/O shoulder surgery   . Hemorrhoid     Past Surgical History:  Procedure Laterality Date  . SHOULDER SURGERY Left    rotator cuff    Current Hospital Medications:  Home Meds:  No current facility-administered medications on file prior to encounter.   Current Outpatient Medications on File Prior to Encounter  Medication Sig Dispense Refill  . amphetamine-dextroamphetamine (ADDERALL) 30 MG tablet Take 30 mg by mouth daily.    Marland Kitchen aspirin-acetaminophen-caffeine (EXCEDRIN MIGRAINE) 250-250-65 MG tablet Take 2 tablets by mouth every 6 (six) hours as needed for headache.    . methocarbamol (ROBAXIN) 500 MG tablet Take 1 tablet (500 mg total) by mouth 2 (two) times daily. 4 tablet 0  . naproxen (NAPROSYN) 375 MG tablet Take 1 tablet (375 mg total) by mouth 2 (two) times daily. 20 tablet 0  . oxyCODONE-acetaminophen (PERCOCET) 7.5-325 MG per tablet Take 1 tablet by mouth.    . oxyCODONE-acetaminophen (PERCOCET/ROXICET) 5-325 MG tablet Take 1 tablet by mouth every 6 (six) hours as needed for severe pain. (Patient not taking: Reported on 04/06/2017) 11 tablet 0  . polyethylene glycol powder (GLYCOLAX/MIRALAX) powder Take 17 g by mouth 2  (two) times daily. (Patient not taking: Reported on 04/06/2017) 255 g 0  . tamsulosin (FLOMAX) 0.4 MG CAPS capsule Take 1 capsule (0.4 mg total) by mouth daily. (Patient not taking: Reported on 04/06/2017) 30 capsule 0  . traMADol (ULTRAM) 50 MG tablet Take 1 tablet (50 mg total) by mouth every 6 (six) hours as needed. (Patient not taking: Reported on 04/06/2017) 60 tablet 0    Allergies: No Known Allergies  No family history on file.  Social History:  reports that he has been smoking cigarettes. He has been smoking about 0.50 packs per day. He has never used smokeless tobacco. He reports current alcohol use. He reports that he does not use drugs.  ROS: A complete review of systems was performed.  All systems are negative except for pertinent findings as noted.  Physical Exam:  Vital signs in last 24 hours: Temp:  [98.2 F (36.8 C)] 98.2 F (36.8 C) (09/15 1742) Pulse Rate:  [71-80] 71 (09/15 2003) Resp:  [18-19] 19 (09/15 2003) BP: (118-130)/(71-80) 118/71 (09/15 2003) SpO2:  [100 %] 100 % (09/15 2003) Weight:  [86.2 kg] 86.2 kg (09/15 1742) Constitutional:  Alert and oriented Cardiovascular: Regular rate Respiratory: Normal respiratory effort on room air GI: Abdomen is soft, nontender, nondistended GU: Circumcised phallus with significant penile shaft ecchymosis, more pronounced on the ventral shaft. Normal urethral meatus without blood. Normal scrotum Neurologic: Grossly intact, no focal deficits Psychiatric: Normal mood and affect  Laboratory Data:  No results for input(s): WBC, HGB, HCT,  PLT in the last 72 hours.  No results for input(s): NA, K, CL, GLUCOSE, BUN, CALCIUM, CREATININE in the last 72 hours.  Invalid input(s): CO3   No results found for this or any previous visit (from the past 24 hour(s)). No results found for this or any previous visit (from the past 240 hour(s)).  Renal Function: No results for input(s): CREATININE in the last 168 hours. CrCl cannot  be calculated (Patient's most recent lab result is older than the maximum 21 days allowed.).  Radiologic Imaging: No results found.  Impression/Recommendation 29yo healthy male with history and physical exam concerning for penile fracture. Cannot rule out concomitant urethral injury given that patient hasn't voided since the injury.  I discussed that role of penile exploration, cystourethroscopy, and possible repair of tunica albuginea and/or urethra if any injury is found. Risks, benefits, and alternatives of the procedure were discussed. The patient would like to proceed.    - OR for penile exploration, cystourethroscopy, and possible repair of tunica albuginea and/or urethra  - Keep NPO in anticipation of above procedure - Patient can likely discharge home after surgery   Margette Fast 01/08/2020, 8:26 PM

## 2020-01-08 NOTE — ED Notes (Signed)
Called [facility] to page Hospitalist for [employer]

## 2020-01-08 NOTE — Anesthesia Procedure Notes (Signed)
Procedure Name: Intubation Performed by: Gean Maidens, CRNA Pre-anesthesia Checklist: Patient identified, Emergency Drugs available, Suction available, Patient being monitored and Timeout performed Patient Re-evaluated:Patient Re-evaluated prior to induction Oxygen Delivery Method: Circle system utilized Preoxygenation: Pre-oxygenation with 100% oxygen Induction Type: IV induction and Rapid sequence Laryngoscope Size: Mac and 4 Grade View: Grade I Tube type: Oral Tube size: 7.5 mm Number of attempts: 1 Airway Equipment and Method: Stylet Placement Confirmation: ETT inserted through vocal cords under direct vision,  positive ETCO2 and breath sounds checked- equal and bilateral Secured at: 22 cm Tube secured with: Tape Dental Injury: Teeth and Oropharynx as per pre-operative assessment

## 2020-01-08 NOTE — ED Provider Notes (Signed)
[facility] EMERGENCY DEPARTMENT Provider Note   CSN: [id number] Arrival date & time: 01/08/20  1737     History Chief Complaint  Patient presents with  . Penis Pain    Jordan Crawford is a 29 y.o. male with no relevant past medical history presents the ED with complaints of penile injury.  Patient reports that for his entire life he has been pinching the shaft of his penis to curve it significantly when he is erect as it "feels good".  He states that this time, he felt it "pop".  He then immediately became flaccid and since then has noticed significant swelling and bruising involving his penis.  He states that he has tenderness at the base, most notably on the right side.  He reports that he is circumcised at baseline, but has noticed skin to start surround his glans penis.  He denies any recent illness, abdominal pain, testicular trauma, or other symptoms/injury.  His wife will drive him home.  HPI     Past Medical History:  Diagnosis Date  . ADHD (attention deficit hyperactivity disorder)   . H/O shoulder surgery   . Hemorrhoid     Patient Active Problem List   Diagnosis Date Noted  . Right scapula fracture 11/18/2013    Past Surgical History:  Procedure Laterality Date  . SHOULDER SURGERY Left    rotator cuff       No family history on file.  Social History   Tobacco Use  . Smoking status: Current Every Day Smoker    Packs/day: 0.50    Types: Cigarettes  . Smokeless tobacco: Never Used  Substance Use Topics  . Alcohol use: Yes  . Drug use: No    Home Medications Prior to Admission medications   Medication Sig Start Date End Date Taking? Authorizing Provider  amphetamine-dextroamphetamine (ADDERALL) 30 MG tablet Take 30 mg by mouth daily.    [provider]  aspirin-acetaminophen-caffeine (EXCEDRIN MIGRAINE) [phone] MG tablet Take 2 tablets by mouth every 6 (six) hours as needed for headache.    [provider]  methocarbamol  (ROBAXIN) 500 MG tablet Take 1 tablet (500 mg total) by mouth 2 (two) times daily. 04/06/17   Couture, Cortni S, PA-C  naproxen (NAPROSYN) 375 MG tablet Take 1 tablet (375 mg total) by mouth 2 (two) times daily. 04/06/17   Couture, Cortni S, PA-C  oxyCODONE-acetaminophen (PERCOCET) 7.5-325 MG per tablet Take 1 tablet by mouth. 12/02/13   [provider]  oxyCODONE-acetaminophen (PERCOCET/ROXICET) 5-325 MG tablet Take 1 tablet by mouth every 6 (six) hours as needed for severe pain. Patient not taking: Reported on 04/06/2017 10/11/16   Mackuen, Courteney Lyn, MD  polyethylene glycol powder (GLYCOLAX/MIRALAX) powder Take 17 g by mouth 2 (two) times daily. Patient not taking: Reported on 04/06/2017 08/12/12   Carleene Cooper, MD  tamsulosin (FLOMAX) 0.4 MG CAPS capsule Take 1 capsule (0.4 mg total) by mouth daily. Patient not taking: Reported on 04/06/2017 10/11/16   Mackuen, Courteney Lyn, MD  traMADol (ULTRAM) 50 MG tablet Take 1 tablet (50 mg total) by mouth every 6 (six) hours as needed. Patient not taking: Reported on 04/06/2017 12/18/13   Lenda Kelp, MD    Allergies    Patient has no known allergies.  Review of Systems   Review of Systems  All other systems reviewed and are negative.   Physical Exam Updated Vital Signs BP 118/71 (BP Location: Right Arm)   Pulse 71   Temp 98.2 F (36.8  C) (Oral)   Resp 19   Ht 5\' 10"  (1.778 m)   Wt 86.2 kg   SpO2 100%   BMI 27.26 kg/m   Physical Exam Vitals and nursing note reviewed. Exam conducted with a chaperone present.  Constitutional:      Appearance: Normal appearance.  HENT:     Head: Normocephalic and atraumatic.  Eyes:     General: No scleral icterus.    Conjunctiva/sclera: Conjunctivae normal.  Pulmonary:     Effort: Pulmonary effort is normal.  Genitourinary:    Comments: Significant swelling and ecchymoses involving shaft of penis.  Foreskin has begun to envelop around the glans penis.  No obvious bleeding.   Tenderness appreciated at the base of shaft, most notably on the right side.  No testicular swelling or evidence of injury.  Sensation intact. Skin:    General: Skin is dry.  Neurological:     Mental Status: He is alert.     GCS: GCS eye subscore is 4. GCS verbal subscore is 5. GCS motor subscore is 6.  Psychiatric:        Mood and Affect: Mood normal.        Behavior: Behavior normal.        Thought Content: Thought content normal.       ED Results / Procedures / Treatments   Labs (all labs ordered are listed, but only abnormal results are displayed) Labs Reviewed  SARS CORONAVIRUS 2 BY RT PCR (HOSPITAL ORDER, PERFORMED IN [facility])    EKG None  Radiology No results found.  Procedures Procedures (including critical care time)  Medications Ordered in ED Medications  HYDROcodone-acetaminophen (NORCO/VICODIN) 5-325 MG per tablet 2 tablet (2 tablets Oral Given 01/08/20 2011)    ED Course  I have reviewed the triage vital signs and the nursing notes.  Pertinent labs & imaging results that were available during my care of the patient were reviewed by me and considered in my medical decision making (see chart for details).  Clinical Course as of Jan 07 2041  Wed Jan 08, 2020  2026 Spoke with Dr. Jan 10, 2020 who asked that patient have ED-to-ED transfer to [facility] and then he will be evaluated there before likely requiring surgical exploration and correction later this evening.     [GG]    Clinical Course User Index [GG] [facility], PA-C   MDM Rules/Calculators/A&P                          Patient's history and physical exam is suggestive of penile fracture.  Will consult with urology as this will likely require surgical exploration/repair.  Patient last ate at 4 PM and states that he has not passed urine since injury.  We will obtain COVID-19 screening.  Consulting urology for admission.  Spoke with Dr. Lorelee New who asked that patient  have ED-to-ED transfer to [facility] and then he will be evaluated there before likely requiring surgical exploration and correction later this evening.    Spoke with Dr. [facility] at [facility] who will accept patient for ED to ED transfer.  Patient will travel by private vehicle as it will expedite transport.  Patient is quite reasonable and his wife will drive him.  He has been given Vicodin but assures me that he will not drive and instead his wife who is in the parking lot will drive.  Discussed risks and benefits and patient would like to proceed  with private vehicle.  [facility] made aware.    Final Clinical Impression(s) / ED Diagnoses Final diagnoses:  Fracture of corpus cavernosum penis, initial encounter    Rx / DC Orders ED Discharge Orders    None       Lorelee New, PA-C 01/08/20 2042    Cathren Laine, MD 01/09/20 1701

## 2020-01-08 NOTE — Anesthesia Preprocedure Evaluation (Signed)
Anesthesia Evaluation  Patient identified by MRN, date of birth, ID band Patient awake    Reviewed: Allergy & Precautions, NPO status , Patient's Chart, lab work & pertinent test results  Airway Mallampati: I  TM Distance: >3 FB Neck ROM: Full    Dental  (+) Teeth Intact, Dental Advisory Given,    Pulmonary Current SmokerPatient did not abstain from smoking.,    Pulmonary exam normal breath sounds clear to auscultation       Cardiovascular negative cardio ROS Normal cardiovascular exam Rhythm:Regular Rate:Normal     Neuro/Psych PSYCHIATRIC DISORDERS ADHDnegative neurological ROS     GI/Hepatic negative GI ROS, Neg liver ROS,   Endo/Other  negative endocrine ROS  Renal/GU negative Renal ROS  negative genitourinary   Musculoskeletal negative musculoskeletal ROS (+)   Abdominal Normal abdominal exam  (+)   Peds negative pediatric ROS (+)  Hematology negative hematology ROS (+)   Anesthesia Other Findings COVID neg  Reproductive/Obstetrics Penile fx, concern for urethral injury                             Anesthesia Physical Anesthesia Plan  ASA: II and emergent  Anesthesia Plan: General   Post-op Pain Management:    Induction: Intravenous, Rapid sequence and Cricoid pressure planned  PONV Risk Score and Plan: 1 and Ondansetron, Dexamethasone, Midazolam and Treatment may vary due to age or medical condition  Airway Management Planned: Oral ETT  Additional Equipment: None  Intra-op Plan:   Post-operative Plan: Extubation in OR  Informed Consent: I have reviewed the patients History and Physical, chart, labs and discussed the procedure including the risks, benefits and alternatives for the proposed anesthesia with the patient or authorized representative who has indicated his/her understanding and acceptance.     Dental advisory given  Plan Discussed with: CRNA  Anesthesia  Plan Comments:         Anesthesia Quick Evaluation

## 2020-01-08 NOTE — ED Provider Notes (Signed)
  Physical Exam  BP 112/78 (BP Location: Right Arm)   Pulse 76   Temp 98.3 F (36.8 C) (Oral)   Resp 16   Ht 5\' 10"  (1.778 m)   Wt 86.2 kg   SpO2 99%   BMI 27.26 kg/m   Physical Exam  ED Course/Procedures   Clinical Course as of Jan 08 2155  Wed Jan 08, 2020  2026 Spoke with Dr. Jan 10, 2020 who asked that patient have ED-to-ED transfer to [facility] and then he will be evaluated there before likely requiring surgical exploration and correction later this evening.     [GG]    Clinical Course User Index [GG] [facility], PA-C    Procedures  MDM  Patient presents from [facility] for penis fracture.  Has not urinated since the episode.  States he does not feels if he has to.  Did not eat on the way.  [facility]. [facility] is seeing the patient currently in the ER      Lindajo Royal, MD 01/08/20 2156

## 2020-01-09 MED ORDER — ACETAMINOPHEN 10 MG/ML IV SOLN
INTRAVENOUS | Status: AC
  Filled 2020-01-09: qty 100

## 2020-01-09 MED ORDER — BACITRACIN 500 UNIT/GM EX OINT
TOPICAL_OINTMENT | CUTANEOUS | Status: DC | PRN
  Administered 2020-01-08: 1 via TOPICAL

## 2020-01-09 MED ORDER — TRAMADOL HCL 50 MG PO TABS: 50.0000 mg | ORAL_TABLET | Freq: Four times a day (QID) | ORAL | 0 refills | Status: AC | PRN

## 2020-01-09 MED ORDER — DEXMEDETOMIDINE HCL 200 MCG/2ML IV SOLN
INTRAVENOUS | Status: DC | PRN
  Administered 2020-01-08: 20 ug via INTRAVENOUS

## 2020-01-09 MED ORDER — BUPIVACAINE HCL (PF) 0.25 % IJ SOLN
INTRAMUSCULAR | Status: DC | PRN
  Administered 2020-01-08: 20 mL

## 2020-01-09 NOTE — Discharge Instructions (Addendum)
1. Avoid sexual activity for the next month 2. Try to avoid having an erection over the next month     Penile Fracture A penile fracture is a break (fracture) in a layer of tissue that lines the penis when the penis is hardened (erect). A penile fracture may involve a break in one, two, or all three of the following:  The structures inside the penis that fill with blood when the penis is erect (corpora cavernosa).  The tight membrane around the corpora cavernosa (tunica albuginea). If this membrane fractures, it can cause blood to leak into surrounding tissues and collect there (hematoma).  The part of the body that drains urine from the bladder (urethra). This is rare. This may cause bleeding from the urethra or an inability to pass urine. Penile fracture is a medical emergency. What are the causes? This condition is usually caused by sudden force on an erect penis, such as forceful sex. What increases the risk? This condition is more likely to develop in men who have:  Any kind of forceful sex.  Sex with a sexual partner on top. This position makes it more likely for the penis to slip out. The partner's weight on the erect penis may cause a fracture. What are the signs or symptoms? The first signs of penile fracture include:  A snapping or cracking sound.  Severe pain in the penis.  Loss of erection. Within a short time, other signs may develop, including:  Swelling of the penis.  Bruising and discoloration of the penis.  An unusual bend in the penis at the location of the fracture (deformity). How is this diagnosed? This condition may be diagnosed based on:  Your symptoms.  The description of how your injury happened.  A physical exam.  Imaging tests, such as: ? Ultrasound. ? X-ray of blood flow in the penis (cavernosogram). ? X-ray of the urethra (urethrogram). ? MRI. How is this treated? A penile fracture requires emergency surgery to:  Remove  hematomas.  Repair fractured tissue and membranes.  Repair the urethra, if necessary. Summary  A penile fracture is a break (fracture) in a layer of tissue that lines the penis when the penis is hardened (erect).  This condition is usually caused by sudden force on an erect penis, such as forceful sex.  Penile fracture is a medical emergency that requires emergency surgery. This information is not intended to replace advice given to you by your health care provider. Make sure you discuss any questions you have with your health care provider. Document Revised: 05/01/2017 Document Reviewed: 05/01/2017 Elsevier Patient Education  2020 [employer] General Anesthesia, Adult, Care After This sheet gives you information about how to care for yourself after your procedure. Your health care provider may also give you more specific instructions. If you have problems or questions, contact your health care provider. What can I expect after the procedure? After the procedure, the following side effects are common:  Pain or discomfort at the IV site.  Nausea.  Vomiting.  Sore throat.  Trouble concentrating.  Feeling cold or chills.  Weak or tired.  Sleepiness and fatigue.  Soreness and body aches. These side effects can affect parts of the body that were not involved in surgery. Follow these instructions at home:  For at least 24 hours after the procedure:  Have a responsible adult stay with you. It is important to have someone help care for you until you are awake and alert.  Rest as needed.  Do not: ? Participate in activities in which you could fall or become injured. ? Drive. ? Use heavy machinery. ? Drink alcohol. ? Take sleeping pills or medicines that cause drowsiness. ? Make important decisions or sign legal documents. ? Take care of children on your own. Eating and drinking  Follow any instructions from your health care provider about eating or drinking  restrictions.  When you feel hungry, start by eating small amounts of foods that are soft and easy to digest (bland), such as toast. Gradually return to your regular diet.  Drink enough fluid to keep your urine pale yellow.  If you vomit, rehydrate by drinking water, juice, or clear broth. General instructions  If you have sleep apnea, surgery and certain medicines can increase your risk for breathing problems. Follow instructions from your health care provider about wearing your sleep device: ? Anytime you are sleeping, including during daytime naps. ? While taking prescription pain medicines, sleeping medicines, or medicines that make you drowsy.  Return to your normal activities as told by your health care provider. Ask your health care provider what activities are safe for you.  Take over-the-counter and prescription medicines only as told by your health care provider.  If you smoke, do not smoke without supervision.  Keep all follow-up visits as told by your health care provider. This is important. Contact a health care provider if:  You have nausea or vomiting that does not get better with medicine.  You cannot eat or drink without vomiting.  You have pain that does not get better with medicine.  You are unable to pass urine.  You develop a skin rash.  You have a fever.  You have redness around your IV site that gets worse. Get help right away if:  You have difficulty breathing.  You have chest pain.  You have blood in your urine or stool, or you vomit blood. Summary  After the procedure, it is common to have a sore throat or nausea. It is also common to feel tired.  Have a responsible adult stay with you for the first 24 hours after general anesthesia. It is important to have someone help care for you until you are awake and alert.  When you feel hungry, start by eating small amounts of foods that are soft and easy to digest (bland), such as toast. Gradually  return to your regular diet.  Drink enough fluid to keep your urine pale yellow.  Return to your normal activities as told by your health care provider. Ask your health care provider what activities are safe for you. This information is not intended to replace advice given to you by your health care provider. Make sure you discuss any questions you have with your health care provider. Document Revised: 04/14/2017 Document Reviewed: 11/25/2016 Elsevier Patient Education  2020 [employer].

## 2020-01-09 NOTE — Transfer of Care (Signed)
Immediate Anesthesia Transfer of Care Note  Patient: Jordan Crawford  Procedure(s) Performed: REPAIR OF FRACTURED PENIS AND EXPLORATION (N/A Penis) CYSTOURETHROSCOPY (Penis)  Patient Location: PACU  Anesthesia Type:General  Level of Consciousness: sedated, patient cooperative and responds to stimulation  Airway & Oxygen Therapy: Patient Spontanous Breathing and Patient connected to face mask oxygen  Post-op Assessment: Report given to RN and Post -op Vital signs reviewed and stable  Post vital signs: Reviewed and stable  Last Vitals:  Vitals Value Taken Time  BP 131/96 01/09/20 0003  Temp 36.6 C 01/09/20 0003  Pulse 60 01/09/20 0005  Resp 15 01/09/20 0004  SpO2 100 % 01/09/20 0005  Vitals shown include unvalidated device data.  Last Pain:  Vitals:   01/08/20 2104  TempSrc: Oral  PainSc: 5          Complications: No complications documented.

## 2020-01-09 NOTE — Anesthesia Postprocedure Evaluation (Signed)
Anesthesia Post Note  Patient: Jordan Crawford  Procedure(s) Performed: REPAIR OF FRACTURED PENIS AND EXPLORATION (N/A Penis) CYSTOURETHROSCOPY (Penis)     Patient location during evaluation: PACU Anesthesia Type: General Level of consciousness: awake and alert, oriented and patient cooperative Pain management: pain level controlled Vital Signs Assessment: post-procedure vital signs reviewed and stable Respiratory status: spontaneous breathing, nonlabored ventilation and respiratory function stable Cardiovascular status: blood pressure returned to baseline and stable Postop Assessment: no apparent nausea or vomiting Anesthetic complications: no Comments: Penile block per surgeon    No complications documented.  Last Vitals:  Vitals:   01/08/20 2104 01/09/20 0003  BP: 112/78 (!) 131/96  Pulse: 76 67  Resp: 16 11  Temp: 36.8 C 36.6 C  SpO2: 99% 100%    Last Pain:  Vitals:   01/08/20 2104  TempSrc: Oral  PainSc: 5                  Lannie Fields

## 2020-01-09 NOTE — Op Note (Signed)
Preoperative diagnosis: Penile fracture   Postoperative diagnosis: Penile fracture  Procedure: 1.  Flexible cystoscopy 2.  Repair of penile fracture  Surgeon: Moody Bruins MD  Resident: Dr. Margette Fast  Anesthesia: General  EBL: 50 cc  Complications: None  Specimens: None  Indication: Mr. Lucus is a 29 year old gentleman who had an erection earlier tonight.  During sexual activity, he noticed a pop noise and immediate detumescence with bruising of the penis consistent with a probable penile fracture.  His physical exam was consistent with this diagnosis.  As such, he was recommended to proceed with operative exploration for further evaluation and possible repair as well as to undergo evaluation of his urethra.  The potential risks, complications, and the expected recovery process associated with this procedure as well as with the potential sequela I of a penile fracture were discussed in detail.  He gave informed consent to proceed.  Description of procedure: The patient was taken the operating room and a general anesthetic was administered.  He was given preoperative antibiotics, placed in the supine position, and prepped and draped in the usual sterile fashion.  A preoperative timeout was performed.  Flexible cystourethroscopy was first performed and there was no evidence of a urethral injury.  No abnormalities were noted within the bladder.  A circumscribing incision was then made along his circumcision scar and carried down through the skin to the dartos fascia.  The dartos fascia was then dissected off the corpora allowing the penis to be degloved.  A large hematoma was noted on the right lateral aspect near the base of the penis.  With further dissection, a defect was identified just to the right side of the urethra on the lateral and ventral side of the right corpora cavernosum.  1/2 inch Penrose drain was used to create a penile tourniquet.  A 3-0 PDS suture was then used  to perform a running repair of this defect.  An artificial erection was then created utilizing sterile saline through a butterfly needle.  This resulted in an adequate erection and a straight penis.  The dartos fascia was then closed over the defect with an additional running 3-0 PDS suture.  The penile skin was then brought back over the penile shaft and the distal and proximal edges of the skin incisions were reapproximated with interrupted 3-0 chromic sutures.  A penile block was then performed with quarter percent Marcaine.  A dressing was applied.  The patient tolerated the procedure well and without complications.  He was able to be awakened and transferred to the recovery unit in satisfactory condition.

## 2020-01-10 ENCOUNTER — Encounter (HOSPITAL_COMMUNITY): Payer: Self-pay | Admitting: Urology
